# Patient Record
Sex: Male | Born: 2000 | Race: White | Hispanic: No
Health system: Western US, Academic
[De-identification: ages and names within clinical notes are randomized; demographics above are authoritative.]

## PROBLEM LIST (undated history)

## (undated) MED ORDER — BD PEN NEEDLE MINI ULTRAFINE 31G X 5 MM MISC
Status: AC
Start: 2019-08-23 — End: ?

---
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
## 2019-08-22 ENCOUNTER — Other Ambulatory Visit (HOSPITAL_BASED_OUTPATIENT_CLINIC_OR_DEPARTMENT_OTHER): Payer: Self-pay | Admitting: Pediatric Endocrinology

## 2019-11-06 ENCOUNTER — Encounter (HOSPITAL_BASED_OUTPATIENT_CLINIC_OR_DEPARTMENT_OTHER): Payer: 59 | Admitting: "Endocrinology

## 2019-11-06 ENCOUNTER — Ambulatory Visit (HOSPITAL_BASED_OUTPATIENT_CLINIC_OR_DEPARTMENT_OTHER): Payer: 59

## 2020-01-13 IMAGING — CR DX Spine Lumbar AP-Lat
1 series · 3 of 3 positions shown · non-contrast
Comparison: None available

Lumbar spine 2 views
INDICATION: Ground-level fall, pain

[Series 1: ap · 0.20mm/px · 3 of 3 slices shown]
[im 1/3]
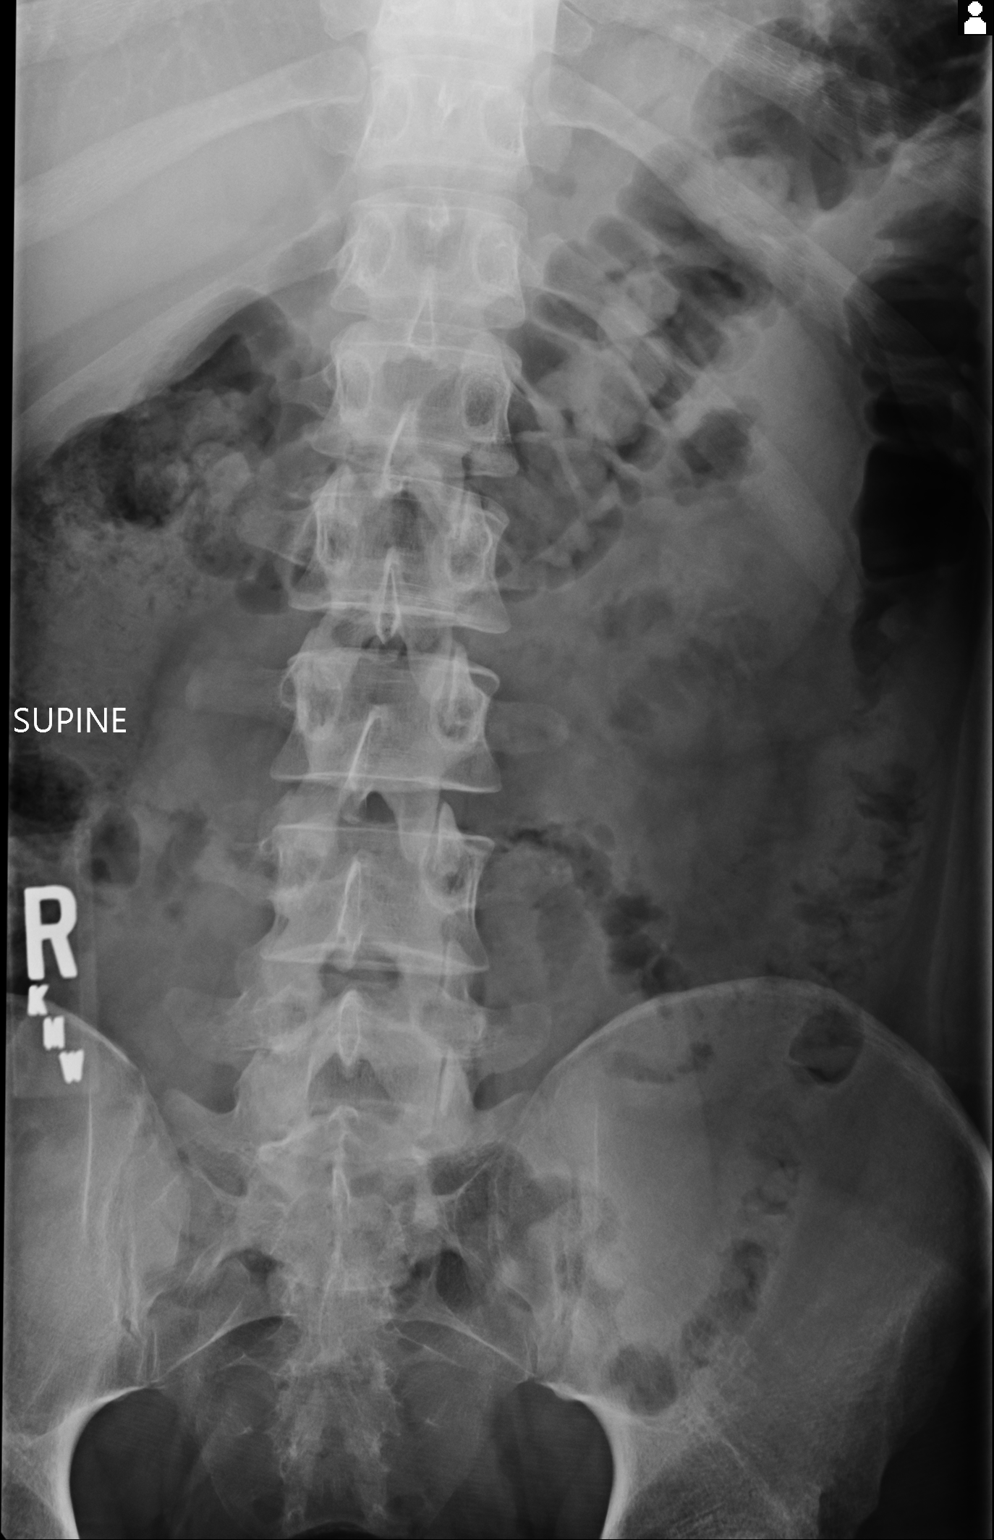
[im 2/3]
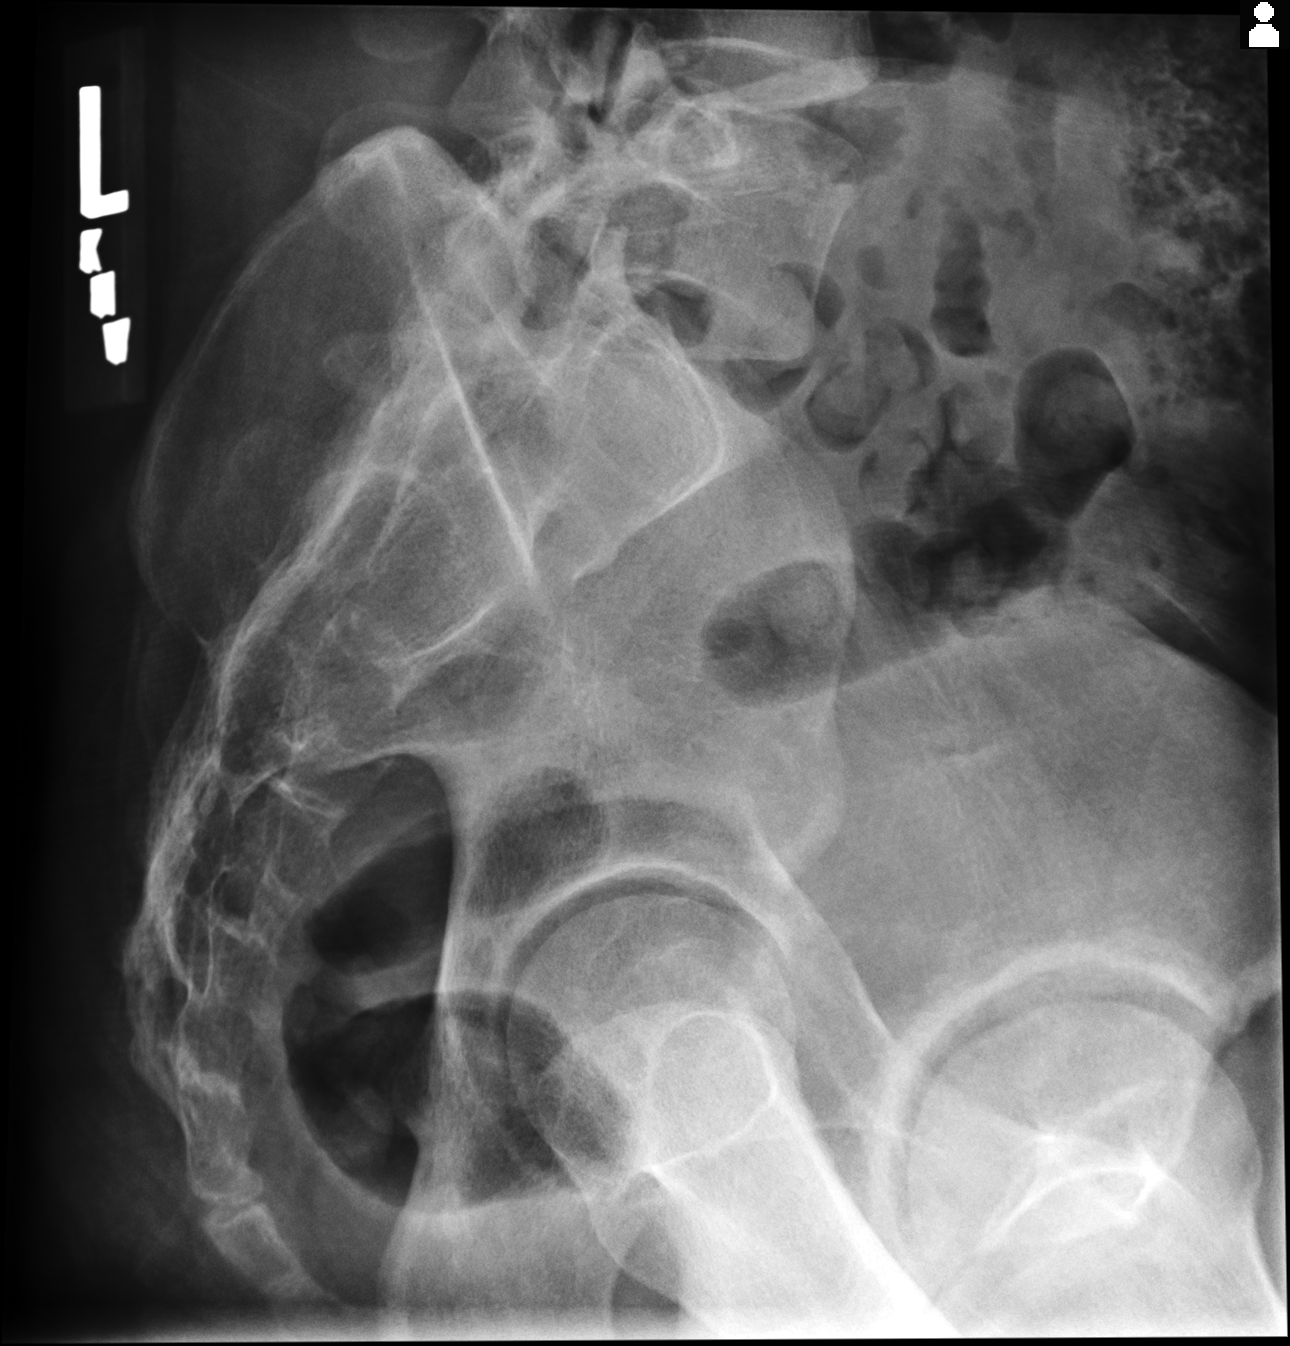
[im 3/3]
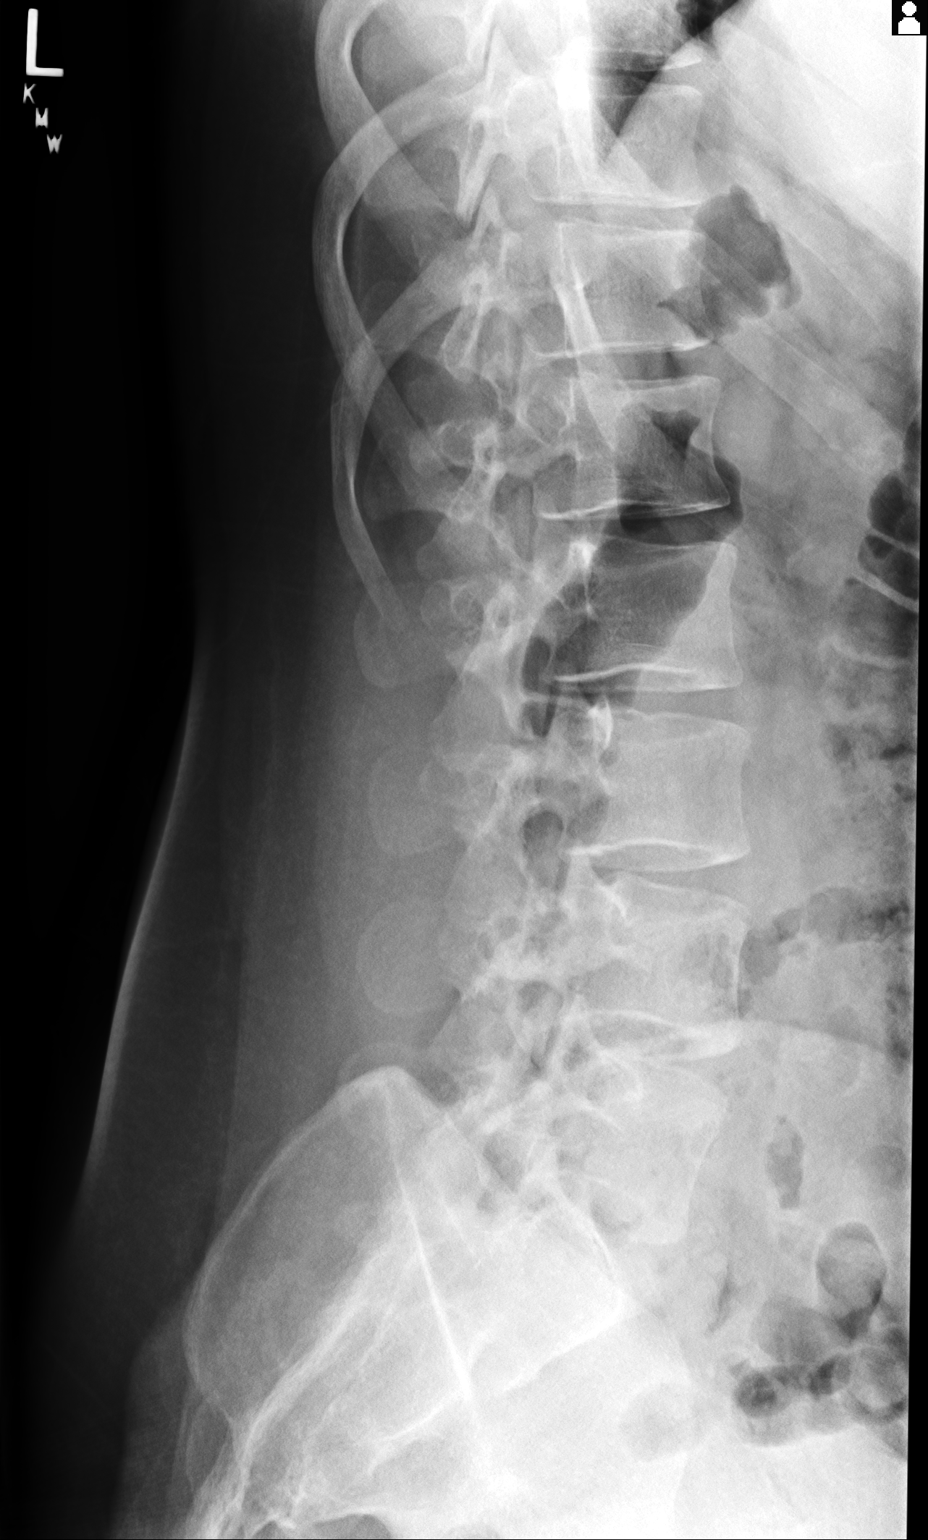

[3 of 3 positions shown; findings below may reference images not displayed]

FINDINGS: There are 5 nonrib-bearing lumbar vertebrae. No evidence of fracture            
 or subluxation. Disc spaces are preserved.
IMPRESSION: No evidence of acute osseous abnormality in the lumbar spine.

## 2020-01-15 ENCOUNTER — Ambulatory Visit (HOSPITAL_BASED_OUTPATIENT_CLINIC_OR_DEPARTMENT_OTHER): Payer: 59

## 2020-04-14 NOTE — Progress Notes (Addendum)
[facility]CENT AND YOUNG [facility]ULT DIABETES CLINIC    Date of service: 04/15/20  Identification: Vincent Anderson is a 19 year old with type 1 diabetes.  -- Date of diabetes diagnosis: 12/25/2004   -- Complications from diabetes:none  -- Secondary diagnoses: ADD, Depression & anxiety (2018), celiac disease    Clinical Care:  -- Received pediatric diabetes care at [facility]; last provider Dr. Danelle Earthly on 08/2019  -- First [facility] AYA visit: 06/2018  -- Last [facility] AYA visit: 04/15/20    Diabetes Self-Management and Interval History:  -- overall doing well  -- in college, studying finances and real estate, just finished with his final exams  -- was able to attend class in-person  -- describes himself as perfectionist, he is meticulous with glycemic control, however afraid hypoglycemia and if he is not sure about CHO content in a meal he prefers to give lower rather than higher dose of insulin  -- was started on Tslim in 08/2019, likes the pump, but struggles with hypolgycemia associated with exercise  -- exercises 5-6x week, trying to lose weight, his weight goal - 220 - 225  -- Reports no significant hyperglycemia/hypoglycemia    Nutrition/Exercise/Safety  -- Meal pattern (e.g., low-carb; skipped meals; late night meals): Gluten-free diet; "not a big breakfast eater", for breakfast he usually has apple, banana, protein bar, lunch - big meal replacement protein shake, salad with chicken, largest meal - dinner - ground beef, tacos, hamburgers, fruit or vegetable, rice, potatoes, french fries, sometimes desert - gluten free cookies or muffins or icecream - overall on low CHO diet with average CHO intake of 96g/day  -- Physical activity: cardio, lifting 45 - 60 min - 4x/week, running 2-5 miles - 45 - 60 min - 5-6x/week has significant drop BG 250 ->80 first 15 min of exercise  -- Exercise management: Used to suspend boluses insulin when on pump, trying not to eating before exercise, since trying to lose weight  -- prandial insulin: 15 min before meal,  sometimes do additional bolus if underestimated with meal, uses 3 boluses for protein shake    -- Driving: yes; uses sensor, no hypoglycemic episodes while driving, has juice with him  -- Drug/EtOH use: seltzer, hard liquor - once a week, 4-5 drinks - puts his insulin pump into exercise mode, takes with CHO, no hypoglycemic episodes with alcohol intake    Since last visit:  -- ER/Hospital visit related to diabetes: No  -- Glucagon use: No [Has Baqsimi]    Review of Systems:   -- A complete ROS was performed. Pertinent negatives: no numbness, no tingling; no abdominal pain, no constipation; no fatigue, no temperature intolerance. The rest of the ROS is negative.     Social History:  -- Will be starting sophomore year in Fall at [employer]  -- Lives in dorms (has strong support)    Family History:  -- No diabetes in family  -- Mom, maternal grandmother, and older sister have thyroid issues  -- Younger sister with celiac disease    Insulin Regimen:       Insulin pump setting:    Basal insulin is manual mode is very close to basal/autoboluses - used in automode    Glucose Monitoring:   - reviewed:    Interpretation of CGM data:  -- Reliable sensor wear: Yes  -- Basal profile appropriate: Yes  -- Postprandial excursion after daytime meals: Mild postprandial hyperglycemia  -- Correction bolus appropriate: Yes  -- Worrisome hypoglycemia: No   Current regimen of the  day, sleep/wake cycle, eating patterns, physical activity is very different from his college life, back to college on 05/06/19    Psychosocial screening:  Last psychologist/SW visit: likely on 04/15/20  Last completed: 04/15/20  -- PHQ-9: 5 (mild); no suicidal ideation  -- PAID-T: 17 (low diabetes distress)  -- GAD-7: 4 (no anxiety)  -- DEPS-R: 13(low risk for disordered eating)   -- CD-RISC 10 - 32    Physical Examination:  BP 116/69    Pulse 62    Ht 6' 0.5" (1.842 m)    Wt (!) 106.6 kg (235 lb)    BMI 31.43 kg/m   GENERAL: not in acute distress, obese, no  cushingoid features  HEENT: atraumatic, normocephalic  EYES: PERRLA, EOMI  NECK: supple  CARDIO: RRR, no peripheral edema  RESP: breathing comfortably on room air  GI: abdomen soft, nondistended  MSK: muscle stength 5/5  SKIN: warm, dry, no lesions  NEURO: A&O x 3, no focal neurologic deficits  PSYCH: pleasant, good mood, adequate judgement    Labs:  - reviewed          Assessment and Plan:   1. Type 1 Diabetes: 19 yo M with DM1 x 15 without complications, celiac disease. \  - glycemia is well controlled: uses CGM 97%,  TIR - 72%, minimal episodes of mild hypoglycemia.   - he reports having significant drop BG with exercise and multiple hypoglycemic episodes that were associated with physical activity, despite using exercise mode: patient was not exercising much lately because of that  - will keep current settings on his insulin pump but make 2nd basal rate mode/activity mode with lower rate of basal insulin and higher ISF to use during exercise in conjunction with exercise mode on insulin pump:    Insulin regimen for exercise activity:  Start time Basal rate Correction factor Carb ratio   midnight 1.7 -> 1.6 1:20 -> 1:30 1:5   3 am 1.5 -> 1.4 1:20 -> 1:30 1:5   7 am 1.8 -> 1.7 1:20 -> 1:30 1:5   11 am 1.6 -> 1.5 1:20 -> 1:30 1:5 -> 1:6   7 pm 1.5 -> 1.4 1:20 -> 1:30 1: 5 -> 1:6     2. AYA Care - Transition Readiness:  Diabetes education: Ongoing needs: yes  Nutrition: Ongoing needs: yes  Healthcare Navigation: Ongoing needs: yes  Psychosocial Support: Ongoing needs: yes  GRADUATE FROM PROGRAM: NO    3. Diabetes Complications/Screening:  Retinopathy Screening: not due  Last exam: August 2021, no evidence of DR  No history of diabetic retinopathy    Neuropathy Screening:  Denies symptoms of peripheral or autonomic neuropathy    Autoimmune Conditions Screening: TSH wnl on 06/2018; positive celiac screen     Dyslipidemia Screening: LDL at desired level on 06/2018    Microalbuminuria Screening: no evidence of  albuminuria - 1 year ago    Blood Pressure: wnl    4. Preventative Care:  Established with PCP: yes Dr. Iona Coach  Received PPSV23 vaccine: yes  Received influenza vaccine: yes (12/2019)  COVID-19: Pfizer - 3 (last booster on 12/2019)    5. Celiac disease  - continue gluten free diet    Follow-up: Will plan to continue care within AYA Diabetes Program at this time with a follow up visit in 3 month's time.    To improve glycemic control, patient is instructed how to self-adjust insulin up to 30% from baseline dose.    Thank you for allowing [place] to take  part in the care of this patient. We will continue to follow. Recommendations conveyed to the primary team. Attending addendum to follow. Please page the endocrine fellow on call with questions.     This patient was seen and discussed with Endocrinology attending, Dr.Weaver    Nadean Corwin, MD  Fellow  Department of Endocrinology

## 2020-04-14 NOTE — Progress Notes (Signed)
[facility] AYA Note  Note Type:  New  Time spent with Mitzi Hansen: 20 mins      CC  Vincent Anderson is a 19 year old y/o male with a 15 year history of Type 1 Diabetes Mellitus here today for his first [place] visit with Dr Con Memos.  [facility] AYA RN  met with Mitzi Hansen for orientation to [place] clinic.    SUBJECTIVE:     Current Diabetes management:  Tandem T Slim with Control IQ  Vendors: pharmacy       Experience with CGM: Dexcom G6-pharmacy    OBJECTIVE:  Not assessed    ASSESSMENT/PLAN:    Today we reviewed the following:    WELCOME!  Contact info for clinic  When to use vs. when to not use eCare/MyChart  Expected response time from medical team    Orientated to [facility] website  Orientation to AYA philosophy  o We are a multidisciplinary care team of physicians (both adult and pediatric endocrinologists), nurse educator, a dietitian, and a Education officer, museum.  o Primary focus of the team is to address the unique challenges that arise during the transition time and prepare young adults for a successful life of managing diabetes    Confirm medications and pharmacy    F/U:  READDY learning needs for follow up visits: see media

## 2020-04-15 ENCOUNTER — Ambulatory Visit (HOSPITAL_COMMUNITY): Payer: Self-pay

## 2020-04-15 ENCOUNTER — Encounter (HOSPITAL_BASED_OUTPATIENT_CLINIC_OR_DEPARTMENT_OTHER): Payer: Self-pay | Admitting: "Endocrinology

## 2020-04-15 ENCOUNTER — Ambulatory Visit (HOSPITAL_BASED_OUTPATIENT_CLINIC_OR_DEPARTMENT_OTHER): Payer: 59

## 2020-04-15 ENCOUNTER — Ambulatory Visit (HOSPITAL_BASED_OUTPATIENT_CLINIC_OR_DEPARTMENT_OTHER): Payer: 59 | Admitting: Clinical

## 2020-04-15 ENCOUNTER — Ambulatory Visit: Payer: 59 | Attending: Clinical | Admitting: "Endocrinology

## 2020-04-15 ENCOUNTER — Other Ambulatory Visit (HOSPITAL_BASED_OUTPATIENT_CLINIC_OR_DEPARTMENT_OTHER): Payer: Self-pay | Admitting: "Endocrinology

## 2020-04-15 VITALS — BP 116/69 | HR 62 | Ht 72.5 in | Wt 235.0 lb

## 2020-04-15 DIAGNOSIS — E109 Type 1 diabetes mellitus without complications: Secondary | ICD-10-CM

## 2020-04-15 LAB — BASIC METABOLIC PANEL
Anion Gap: 9 (ref 4–12)
Calcium: 9.6 mg/dL (ref 8.9–10.2)
Carbon Dioxide, Total: 29 meq/L (ref 22–32)
Chloride: 100 meq/L (ref 98–108)
Creatinine: 0.85 mg/dL (ref 0.51–1.18)
Glucose: 184 mg/dL — ABNORMAL HIGH (ref 62–125)
Potassium: 4.1 meq/L (ref 3.6–5.2)
Sodium: 138 meq/L (ref 135–145)
Urea Nitrogen: 13 mg/dL (ref 8–21)
eGFR by CKD-EPI: >60 mL/min/{1.73_m2} (ref >59)

## 2020-04-15 LAB — LIPID PANEL
Cholesterol (LDL): 111 mg/dL (ref <130)
Cholesterol/HDL Ratio: 3.3
HDL Cholesterol: 51 mg/dL (ref >39)
Non-HDL Cholesterol: 118 mg/dL (ref 0–159)
Total Cholesterol: 169 mg/dL (ref <200)
Triglyceride: 37 mg/dL (ref <150)

## 2020-04-15 LAB — HEMOGLOBIN A1C, RAPID: Hemoglobin A1C: 6.6 % — ABNORMAL HIGH (ref 4.0–6.0)

## 2020-04-15 LAB — TSH WITH REFLEXIVE FREE T4: TSH with Reflexive Free T4: 3.038 u[IU]/mL (ref 0.400–5.000)

## 2020-04-15 MED ORDER — BAQSIMI TWO PACK 3 MG/DOSE NA POWD
3.0000 mg | Freq: Once | NASAL | 3 refills | Status: DC | PRN
Start: 2020-04-15 — End: 2021-10-10

## 2020-04-15 MED ORDER — PRECISION XTRA KETONE VI STRP
ORAL_STRIP | 3 refills | Status: DC
Start: 2020-04-15 — End: 2022-09-28

## 2020-04-15 MED ORDER — LANTUS SOLOSTAR 100 UNIT/ML SC SOPN
PEN_INJECTOR | SUBCUTANEOUS | 3 refills | Status: DC
Start: 2020-04-15 — End: 2020-10-01

## 2020-04-15 MED ORDER — INSULIN ASPART 100 UNIT/ML IJ SOLN
INTRAMUSCULAR | 3 refills | Status: DC
Start: 2020-04-15 — End: 2021-04-14

## 2020-04-15 MED ORDER — BLOOD GLUCOSE MONITORING SUPPL KIT
PACK | 1 refills | Status: DC
Start: 2020-04-15 — End: 2022-01-26

## 2020-04-15 MED ORDER — GLUCOSE BLOOD VI STRP
ORAL_STRIP | 3 refills | Status: DC
Start: 2020-04-15 — End: 2022-01-26

## 2020-04-15 MED ORDER — DEXCOM G6 SENSOR MISC
3 refills | Status: DC
Start: 2020-04-15 — End: 2021-04-14

## 2020-04-15 MED ORDER — AUTOSOFT 90 INFUSION SET MISC
3 refills | Status: AC
Start: 2020-04-15 — End: ?

## 2020-04-15 MED ORDER — DEXCOM G6 TRANSMITTER MISC
3 refills | Status: DC
Start: 2020-04-15 — End: 2021-01-27

## 2020-04-15 MED ORDER — LANCETS THIN MISC
3 refills | Status: AC
Start: 2020-04-15 — End: ?

## 2020-04-15 MED ORDER — LANCET DEVICE WITH EJECTOR MISC
1 refills | Status: AC
Start: 2020-04-15 — End: ?

## 2020-04-15 MED ORDER — T:SLIM X2 3ML CARTRIDGE MISC
3 refills | Status: AC
Start: 2020-04-15 — End: ?

## 2020-04-15 MED ORDER — INSULIN PEN NEEDLE 32G X 4 MM MISC
3 refills | Status: AC
Start: 2020-04-15 — End: ?

## 2020-04-15 NOTE — Progress Notes (Signed)
[place] AYA Diabetes Transition Program  Social Work Biopsychosocial Assessment    REFERRAL INFORMATION    Reason for referral: Vincent Anderson presents for a new visit in the AYA [facility] after transferring from [facility]  Present for Interview: Vincent Anderson   Presenting Medical Concern: Type 1 Diabetes; Celiac, Depression; Anxiety  Diagnosis Date: 2006  Diabetes management: DexCom/Tandem    SOCIAL HISTORY    Lives where: [place], [place] in college dorm. When home for the holidays, lives in [place], [place]. This coming summer Vincent Anderson will be living in [facility] for 8 weeks.   Lives with: Vincent Anderson lives with his parents and 59 year old sister when he is home for the holidays. At school he lives in a dorm but has a single. He reports that his room is bigger than typical and there is a constant flow of people in and out of his room to hang out.   Finances/Income: Vincent Anderson is financially supported by his parents and does not work.  Insurance: Vincent Anderson is covered by his parents insurance on [employer]   Social Stressors: Vincent Anderson identifies academic pressure as his number one stressor right now. He just completed finals for the quarter and reports a high level of stress and lack of sleep related to these.   Social Supports: Vincent Anderson reports feeling very well supported by friends and family.   Family make-up, functioning and involvement: This was not thoroughly discussed. Vincent Anderson's parents and 27 year old sister live in [place].   Social functioning: No self reported concerns. Vincent Anderson does describe himself as having a "short social battery," and reports that he recharges with time alone to reflect on his thoughts. He also describes plenty of social activities that he engages in at school.   Interests and hobbies: This was not specifically discussed. Vincent Anderson does report going to football games at school.   Religious, spiritual or cultural details: Nothing relevant reported.   Work, where, schedule, accommodations, and any concerns: Vincent Anderson is not  currently working. He has an internship lined up for the summer in [place] but details of this were not discussed yet.   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 Vincent Anderson didn't report any concerns outside of the typical stressors of school work. Accommodations were not discussed today.     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 Vincent Anderson reports that currently his symptoms of depression and anxiety are well managed. He reports that sometimes he wonders if his stress about school is heightened because of his anxiety. He manages by keeping lists to make sure he doesn't forget things he needs to do when it comes to school work and doctors appointments. Vincent Anderson doesn't feel this habit of list keeping is obsessive or intrusive. Vincent Anderson has no history of SI or NSSI.   Past or current Psychological interventions: Vincent Anderson has been on a shifting dose of Effexor over the years and sees a psychiatrist at [facility] every couple of months. He hasn't seen a therapist separately but finds it helpful to talk through thoughts and emotions with his psychiatrist.   Substance use: Vincent Anderson reports some social drinking in the context of social events, such as  tailgating before a football game. He reports that he is typically the designated driver, but will sometimes drink. He feels "okay" about drinking and managing diabetes and reports he has never had a "scare" with it, and that he has never been in a position of being out of control (never blacked out). He reports he prepares for drinking by lowering basal rights and making sure to eat before he goes to sleep.   Other risk behaviors:  Vincent Anderson reports very occasional vaping.    DIABETES CARE & FUNCTIONING    Course of coping with diabetes since diagnosis, and current thoughts and feelings about diabetes: This was not explored in very much detail today due to timing restrictions. Vincent Anderson shared that he has had "diabetes for forever," and feels very comfortable managing it. Vincent Anderson noted that the transition from high school to college with diabetes went smoothly since his parents had transitioned over much of the daily responsibilities of diabetes to [place] during high school.   Current diabetes care, level of independence, areas of concerns: Vincent Anderson is completely independent managing daily care. He and his mom co-manage prescriptions; sometimes Vincent Anderson refills and sometimes his mom does, and they each communicate with the other. Vincent Anderson makes his own doctors appointments now. Vincent Anderson currently "delegates" calls to Tandem, DexCom and Vincent Anderson Pouch to his mom if issues arise since he feels this is an area he doesn't understand as well.   Personal goals regarding diabetes management: Vincent Anderson is reports that he has a goal of learning more about how to navigate insurance and prescriptions independently. We discussed some steps to take towards this goal. These included communicating with mom about this goal while home for the holidays, "shadowing" mom when she makes calls, and slowing starting to take over small tasks. SW also referred Vincent Anderson to [employer] on [employer] for some learning.     IMPRESSION  Vincent Anderson is a 19 year old male patient with an approximate 16 year history of type 1 diabetes, also with a history of depression and anxiety. Vincent Anderson appears to be managing the daily aspects of his diabetes quiet well on his own. He continues to receive support from parents to handle administrative aspects of care, but there are some aspects of this responsibility that he has taken over since moving away to college. Itzael self identifies  learning more about how his insurance works and how to navigate this system as an overall goal of his while in the care of the AYA team. There were no significant psychosocial concerns that arose today during our visit.     INTERVENTIONS   Provided information on the role of the social worker in the AYA clinic and discussed what to expect regarding visits and follow-up, as well as how to reach SW between visits   Gathered biopsychosocial history to assist and inform resources and interventions offered   Assessed current coping with diabetes and other psychosocial stressors   Assessed current state of independence in diabetes management and understanding of healthcare navigation     PLAN   SW will plan to follow-up with Roni as needed to provide support around goals of [facility] independence

## 2020-04-15 NOTE — Patient Instructions (Signed)
New activity mode: Exc. Use this profile when exercising. Also turn on Activity mode.     Send me your [employer] login and password

## 2020-04-16 NOTE — Progress Notes (Signed)
I saw and evaluated the patient and agree with Dr.Zykova's note.

## 2020-04-24 ENCOUNTER — Encounter (HOSPITAL_BASED_OUTPATIENT_CLINIC_OR_DEPARTMENT_OTHER): Payer: Self-pay | Admitting: "Endocrinology

## 2020-04-29 ENCOUNTER — Other Ambulatory Visit (HOSPITAL_BASED_OUTPATIENT_CLINIC_OR_DEPARTMENT_OTHER): Payer: Self-pay | Admitting: Pharmacist

## 2020-04-29 NOTE — Telephone Encounter (Signed)
Refill not appropriate. Patient using Tandem pump. See 12/29 e-care message

## 2020-04-29 NOTE — Telephone Encounter (Signed)
Sent email to Tawnya Crook to ask if she can assist getting [facility] set up with supplies with new AYA provider.

## 2020-04-29 NOTE — Telephone Encounter (Signed)
Not on the current med list.

## 2020-06-14 ENCOUNTER — Encounter (HOSPITAL_BASED_OUTPATIENT_CLINIC_OR_DEPARTMENT_OTHER): Payer: Self-pay | Admitting: "Endocrinology

## 2020-06-18 NOTE — Telephone Encounter (Signed)
No data on [facility] since 04/15/2020.

## 2020-08-12 ENCOUNTER — Encounter (HOSPITAL_BASED_OUTPATIENT_CLINIC_OR_DEPARTMENT_OTHER): Payer: Self-pay | Admitting: "Endocrinology

## 2020-08-14 NOTE — Telephone Encounter (Signed)
Appointment changed to Telemedicine

## 2020-08-21 ENCOUNTER — Encounter (HOSPITAL_BASED_OUTPATIENT_CLINIC_OR_DEPARTMENT_OTHER): Payer: Self-pay

## 2020-08-30 ENCOUNTER — Encounter (HOSPITAL_BASED_OUTPATIENT_CLINIC_OR_DEPARTMENT_OTHER): Payer: Self-pay | Admitting: "Endocrinology

## 2020-08-30 NOTE — Telephone Encounter (Signed)
[facility] Diabetes Clinic Telemedicine Visit 09/02/2020 - Questionnaires

## 2020-09-01 ENCOUNTER — Telehealth (HOSPITAL_BASED_OUTPATIENT_CLINIC_OR_DEPARTMENT_OTHER): Payer: Self-pay | Admitting: "Endocrinology

## 2020-09-01 NOTE — Telephone Encounter (Signed)
E-care message to pt to follow up.    LVM ALSO

## 2020-09-02 ENCOUNTER — Ambulatory Visit: Payer: 59 | Attending: "Endocrinology | Admitting: "Endocrinology

## 2020-09-02 ENCOUNTER — Encounter (HOSPITAL_BASED_OUTPATIENT_CLINIC_OR_DEPARTMENT_OTHER): Payer: Self-pay | Admitting: "Endocrinology

## 2020-09-02 ENCOUNTER — Encounter (HOSPITAL_BASED_OUTPATIENT_CLINIC_OR_DEPARTMENT_OTHER): Payer: Self-pay

## 2020-09-02 VITALS — Ht 72.0 in | Wt 225.0 lb

## 2020-09-02 DIAGNOSIS — E109 Type 1 diabetes mellitus without complications: Secondary | ICD-10-CM | POA: Insufficient documentation

## 2020-09-02 NOTE — Progress Notes (Signed)
ADOLESCENT AND YOUNG ADULT DIABETES CLINIC  Follow-up visit    Date of service: 09/02/20  Identification: Vincent Anderson is a 20 year old with type 1 diabetes.  -- Date of diabetes diagnosis: 12/25/2004   -- Complications from diabetes:none  -- Secondary diagnoses: ADD, Depression & anxiety (2018), celiac disease    Clinical Care:  -- Received pediatric diabetes care at [facility]; last provider Dr. Danelle Earthly on 08/2019  -- First [facility] AYA visit: 06/2018  -- Last [facility] AYA visit: 04/15/20    Diabetes Self-Management and Interval History:  Patient is overall doing well.  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  At the last visit, we reduced the patient's basal rates and the patient says that he has also been exercising more, working on his diet and has lost about 10 pounds.  Patient has time in range has improved to 78%.  Patient says that he has less hypoglycemic episodes however he does note exercise-induced hypoglycemia is the most common cause.  Otherwise no new medical problems, no hospitalizations, patient says he is doing well in terms of his mental health and overall is excited for his internship.        Nutrition/Exercise/Safety  -- Meal pattern (e.g., low-carb; skipped meals; late night meals): Gluten-free diet; "not a big breakfast eater", for breakfast he usually has apple, banana, protein bar, lunch - big meal replacement protein shake, salad with chicken, largest meal - dinner - ground beef, tacos, hamburgers, fruit or vegetable, rice, potatoes, french fries, sometimes desert - gluten free cookies or muffins or icecream - overall on low CHO diet with average CHO intake of 96g/day  -- Physical activity: cardio, lifting 45 - 60 min - 4x/week, running 2-5 miles - 45 - 60 min - 5-6x/week has significant drop BG 250 ->80 first 15 min of exercise  -- Exercise management: Used to suspend boluses insulin when on pump, trying not to eating before exercise, since trying to  lose weight  -- prandial insulin: 15 min before meal, sometimes do additional bolus if underestimated with meal, uses 3 boluses for protein shake    -- Driving: yes; uses sensor, no hypoglycemic episodes while driving, has juice with him  -- Drug/EtOH use: seltzer, hard liquor - once a week, 4-5 drinks - puts his insulin pump into exercise mode, takes with CHO, no hypoglycemic episodes with alcohol intake    Since last visit:  -- ER/Hospital visit related to diabetes: No  -- Glucagon use: No [Has Baqsimi]    Review of Systems:   -- A complete ROS was performed. Pertinent negatives: no numbness, no tingling; no abdominal pain, no constipation; no fatigue, no temperature intolerance. The rest of the ROS is negative.     Social History:  -- Will be starting junior year in Fall at [employer]  -- Lives in dorms (has strong support)    Family History:  -- No diabetes in family  -- Mom, maternal grandmother, and older sister have thyroid issues  -- Younger sister with celiac disease    Insulin Regimen:       Insulin pump setting:    We noted the patient's exercise profile could use a reduction in terms of the basal rate as well as an adjustment to the correction factor.  We recommend changing the correction factor to 1: 50, and we will reduce the basal rate to help with exercise-induced hyperglycemia.    Glucose Monitoring:   -  reviewed:          Time in range has improved to 78%, patient overall is having excellent glycemic control.    Interpretation of CGM data:  -- Reliable sensor wear: Yes  -- Basal profile appropriate: Yes  -- Correction bolus appropriate: Yes  -- Worrisome hypoglycemia: No   Current regimen of the day, sleep/wake cycle, eating patterns, physical activity is very different from his college life, back to college in the fall    Psychosocial screening:  Last psychologist/SW visit: likely on 07/2020  Last completed: 07/2020  -- PHQ-9: 5 (mild); no suicidal ideation  -- PAID-T: 17 (low diabetes distress)  --  GAD-7: 4 (no anxiety)  -- DEPS-R: 13(low risk for disordered eating)   -- CD-RISC 10 - 32    Physical Examination:  There were no vitals taken for this visit.  GENERAL: not in acute distress, obese, no cushingoid features  HEENT: atraumatic, normocephalic  EYES: PERRLA, EOMI  NEURO: A&O x 3, no focal neurologic deficits  PSYCH: pleasant, good mood, adequate judgement    Labs:  - reviewed            Labs from December 2021 show LDL of 111, normal TSH of 3, A1c of 6.6 which is at goal  Assessment and Plan:   1. Type 1 Diabetes: 20 yo M with DM1 x 15 without complications, celiac disease. \  - glycemia is well controlled: uses CGM 97%,  TIR - 78%, minimal episodes of mild hypoglycemia likely from exercise-induced hypoglycemia, we discussed how tandem insulin pump is challenging with exercise mode because it still gives correction boluses which can lead to hypoglycemia.   - will keep current settings on his insulin pump but make adjustments to the patient's exercise activity mode to lower the basal rate and to adjust the correction factor:    Insulin regimen for exercise activity:  Start time Basal rate Correction factor Carb ratio   midnight 1.6 -> 1.5 1:30 -> 1:50 1:5   3 am  1.4 1:30 -> 1:50 1:5   7 am 1.6-->1.4 1:30 -> 1:50 1:5   11 am 1.5 -> 1.4 1:30 -> 1:50  1:6   7 pm 1.4 1:30 -> 1:50 1:6             2. AYA Care - Transition Readiness:  Diabetes education: Ongoing needs: yes  Nutrition: Ongoing needs: yes  Healthcare Navigation: Ongoing needs: yes  Psychosocial Support: Ongoing needs: yes  GRADUATE FROM PROGRAM: NO    3. Diabetes Complications/Screening:  Retinopathy Screening: not due  Last exam: August 2021, no evidence of DR  No history of diabetic retinopathy    Neuropathy Screening:  Denies symptoms of peripheral or autonomic neuropathy    Autoimmune Conditions Screening: TSH wnl on 03/2020; positive celiac screen     Dyslipidemia Screening: LDL at desired level on 03/2020    Microalbuminuria Screening: no  evidence of albuminuria - 1 year ago    Blood Pressure: wnl    4. Preventative Care:  Established with PCP: yes Dr. Iona Coach  Received PPSV23 vaccine: yes  Received influenza vaccine: yes (12/2019)  COVID-19: Pfizer - 3 (last booster on 12/2019)    5. Celiac disease  - continue gluten free diet    Follow-up: Will plan to continue care within AYA Diabetes Program at this time with a follow up visit in 3 month's time.    To improve glycemic control, patient is instructed how to self-adjust insulin up to 30% from  baseline dose.    Thank you for allowing [place] to take part in the care of this patient. We will continue to follow. Recommendations conveyed to the primary team. Attending addendum to follow. Please page the endocrine fellow on call with questions.     This patient was seen and discussed with Endocrinology attending, Dr.Weaver    Thank you for allowing me to be involved in this patient's care.    Monica Martinez MD  Fellow, Department of Endocrinology  [facility]

## 2020-09-02 NOTE — Patient Instructions (Signed)
Here are a few tips to help you navigate your healthcare needs:      IF YOU ARE HAVING AN EMERGENCY CALL 911     Refills:  Call your pharmacy at least 4 working days before you run out. Do not call the clinic or use email or eCare for refill requests, it's quicker and safer to go through the pharmacy.    . Test Results: Most results are available within 7 days; many are available sooner through eCare. I will contact you of test results by eCare or letter unless there is something urgent, in which case I will call you sooner.     Use eCare to securely communicate with me or our care team for non-urgent issues (questions or concerns that can wait 3 working days before requiring an answer). If you have a long or complex question or have a new issue, please make an appointment to discuss.  Call [phone] to sign-up for eCare or ask your MA to help you sign-up today.     . Urgent Symptoms:  Call [phone], at any time and select option 8. Our clinic staff will help you during regular hours; after hours, our on-call nurses will help you.

## 2020-09-03 NOTE — Progress Notes (Signed)
I saw and evaluated the patient and agree with Dr.Popli's note

## 2020-09-13 ENCOUNTER — Telehealth (HOSPITAL_BASED_OUTPATIENT_CLINIC_OR_DEPARTMENT_OTHER): Payer: Self-pay

## 2020-09-13 DIAGNOSIS — E109 Type 1 diabetes mellitus without complications: Secondary | ICD-10-CM

## 2020-09-13 NOTE — Telephone Encounter (Signed)
PAC received PA request from [facility] via fax:

## 2020-09-22 ENCOUNTER — Emergency Department: Payer: Self-pay

## 2020-09-30 ENCOUNTER — Other Ambulatory Visit (HOSPITAL_BASED_OUTPATIENT_CLINIC_OR_DEPARTMENT_OTHER): Payer: Self-pay

## 2020-09-30 NOTE — Telephone Encounter (Signed)
Forwarding to RN team for f/u

## 2020-09-30 NOTE — Telephone Encounter (Signed)
Lantus Solostar is a non-formulary medication on the patients' insurance plan [employer]).     Please consider changing to [employer].   (The prescription can be entered using this current telephone encounter)    Pharmacy: [id number] [phone] [address] [place] [phone]     If the option(s) provided are not are appropriate please provide clinical rationale so a prior authorization exception may be pursued.     Note: Pharmacy's fax was from [facility] in [place]. Not sure if pt still goes to [facility].

## 2020-10-01 NOTE — Telephone Encounter (Signed)
Pending prescription for [employer]- preferred per insurance formulary. Routing to [facility] for signature.

## 2020-10-01 NOTE — Addendum Note (Signed)
Addended by: Livingston Diones on: 10/01/2020 07:45 AM     Modules accepted: Orders

## 2020-10-02 MED ORDER — BASAGLAR KWIKPEN 100 UNIT/ML SC SOPN
PEN_INJECTOR | SUBCUTANEOUS | 1 refills | Status: DC
Start: 2020-10-02 — End: 2022-09-28

## 2020-10-02 NOTE — Addendum Note (Signed)
Addended by: Sanjuana Mae on: 10/02/2020 08:34 AM     Modules accepted: Orders

## 2020-11-28 ENCOUNTER — Telehealth (HOSPITAL_BASED_OUTPATIENT_CLINIC_OR_DEPARTMENT_OTHER): Payer: Self-pay

## 2020-11-28 ENCOUNTER — Encounter (HOSPITAL_BASED_OUTPATIENT_CLINIC_OR_DEPARTMENT_OTHER): Payer: Self-pay | Admitting: "Endocrinology

## 2020-11-28 NOTE — Telephone Encounter (Signed)
Pt states he has new insurance and needs PA done Dexcom sensors and trasnmitters.

## 2020-12-03 ENCOUNTER — Other Ambulatory Visit (HOSPITAL_BASED_OUTPATIENT_CLINIC_OR_DEPARTMENT_OTHER): Payer: Self-pay

## 2020-12-03 NOTE — Telephone Encounter (Signed)
Duplicate- resolved in PA encounter.

## 2020-12-03 NOTE — Telephone Encounter (Signed)
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    The other plan covers Dexcom G6. Test claims successful for Receiver, Transmitter and Sensors.    [employer] ([facility]))      Since this is a new plan, please have pharmacy run claims for what needs to be refilled.      If a prior authorization is needed, the pharmacy can fax request to clinic.    Thank You,  Gaynelle Arabian, CPhT  [place]    NOTE:  The [facility] does not process requests for Tier Exceptions.     PAC does not provide co-pay billing support.

## 2020-12-23 ENCOUNTER — Encounter (HOSPITAL_BASED_OUTPATIENT_CLINIC_OR_DEPARTMENT_OTHER): Payer: Self-pay | Admitting: "Endocrinology

## 2020-12-25 NOTE — Telephone Encounter (Signed)
Incoming SMN from [facility]  CN.09-02-20 LAB 04-15-20  To Provider for review and signature.  Signed, faxed, scanned to media

## 2021-01-27 ENCOUNTER — Other Ambulatory Visit (HOSPITAL_BASED_OUTPATIENT_CLINIC_OR_DEPARTMENT_OTHER): Payer: Self-pay | Admitting: "Endocrinology

## 2021-01-27 DIAGNOSIS — E109 Type 1 diabetes mellitus without complications: Secondary | ICD-10-CM

## 2021-01-28 MED ORDER — DEXCOM G6 TRANSMITTER MISC
1 refills | Status: DC
Start: 2021-01-28 — End: 2021-04-14

## 2021-01-30 ENCOUNTER — Telehealth (HOSPITAL_BASED_OUTPATIENT_CLINIC_OR_DEPARTMENT_OTHER): Payer: Self-pay | Admitting: "Endocrinology

## 2021-01-30 DIAGNOSIS — E109 Type 1 diabetes mellitus without complications: Secondary | ICD-10-CM

## 2021-01-30 NOTE — Telephone Encounter (Signed)
Returned Pt's mother's call. Pt's mother Rinaldo Cloud) states pt goes to [place] out of state and has a small window to make it in for his appointments. Pt is scheduled with Sarah on 12/19 @ 2:30 pm.     Pt would like to get labs done when he is town this month, Pt's mom states he will go to any [place] lab and will send eCare message if they plan to get labs done outside of [place].

## 2021-01-30 NOTE — Telephone Encounter (Signed)
RETURN CALL: Voicemail - Detailed Message      SUBJECT:  Appointment Request     REASON FOR VISIT: Check-up  PREFERRED DATE/TIME: 10/17-10/21, 12/19-1/13/2023  - any time   ADDITIONAL INFORMATION: Patient's mother called to make an appointment for the patient. Per the patient's schedule Dr. Alben Spittle did not have any available. CCR unable to connect with clinic. Patient's mother was advised a message would be sent to the clinic.     Also wondering about labs, if the patient is due for them? Can they get them without an upcoming appointment while the patient is in town?

## 2021-04-03 ENCOUNTER — Encounter (HOSPITAL_BASED_OUTPATIENT_CLINIC_OR_DEPARTMENT_OTHER): Payer: Self-pay

## 2021-04-11 ENCOUNTER — Encounter (HOSPITAL_BASED_OUTPATIENT_CLINIC_OR_DEPARTMENT_OTHER): Payer: Self-pay | Admitting: Student in an Organized Health Care Education/Training Program

## 2021-04-11 NOTE — Telephone Encounter (Signed)
[facility] Appointment 12/19 - Questionnaires

## 2021-04-14 ENCOUNTER — Other Ambulatory Visit (HOSPITAL_BASED_OUTPATIENT_CLINIC_OR_DEPARTMENT_OTHER): Payer: Self-pay | Admitting: Student in an Organized Health Care Education/Training Program

## 2021-04-14 ENCOUNTER — Ambulatory Visit
Payer: No Typology Code available for payment source | Attending: Student in an Organized Health Care Education/Training Program | Admitting: Student in an Organized Health Care Education/Training Program

## 2021-04-14 ENCOUNTER — Encounter (HOSPITAL_BASED_OUTPATIENT_CLINIC_OR_DEPARTMENT_OTHER): Payer: Self-pay | Admitting: Student in an Organized Health Care Education/Training Program

## 2021-04-14 VITALS — BP 122/74 | HR 76 | Ht 72.5 in | Wt 230.0 lb

## 2021-04-14 DIAGNOSIS — E109 Type 1 diabetes mellitus without complications: Secondary | ICD-10-CM | POA: Insufficient documentation

## 2021-04-14 DIAGNOSIS — Z9641 Presence of insulin pump (external) (internal): Secondary | ICD-10-CM | POA: Insufficient documentation

## 2021-04-14 DIAGNOSIS — E1065 Type 1 diabetes mellitus with hyperglycemia: Secondary | ICD-10-CM | POA: Insufficient documentation

## 2021-04-14 LAB — HEMOGLOBIN A1C, RAPID: Hemoglobin A1C: 6.6 % — ABNORMAL HIGH (ref 4.0–6.0)

## 2021-04-14 MED ORDER — INSULIN ASPART 100 UNIT/ML IJ SOLN
INTRAMUSCULAR | 3 refills | Status: DC
Start: 2021-04-14 — End: 2021-05-06

## 2021-04-14 MED ORDER — BAQSIMI ONE PACK 3 MG/DOSE NA POWD
NASAL | 1 refills | Status: DC
Start: 2021-04-14 — End: 2022-09-28

## 2021-04-14 MED ORDER — DEXCOM G6 TRANSMITTER MISC
1 refills | Status: DC
Start: 2021-04-14 — End: 2021-10-10

## 2021-04-14 MED ORDER — DEXCOM G6 SENSOR MISC
3 refills | Status: DC
Start: 2021-04-14 — End: 2021-10-10

## 2021-04-14 NOTE — Patient Instructions (Addendum)
Hi Uday,    It was an absolute pleasure seeing you today. Thanks for chatting about your diabetes with me! Here's what we discussed:    ISF 7AM on, 1:20 --> 1:18  ICR 7AM on, 1:5 --> 1:4.8    Reminder to schedule eye exam    If you have any questions/concerns, please feel free to reach out to me via MyChart.     Sincerely,  Remigio Eisenmenger, PA-C

## 2021-04-14 NOTE — Progress Notes (Signed)
[facility]AB[facility] CLINIC    Date of service: 04/14/21    Identification: Vincent Anderson is a 20 year old with type 1 diabetes.  -- Date of diabetes diagnosis: 12/25/2004   -- Complications from diabetes:none  -- Secondary diagnoses: ADD, Depression & anxiety (2018), celiac disease    Clinical Care:  -- Received pediatric diabetes care at [facility]; last provider Dr. Danelle Earthly on 08/2019  -- First [facility] AYA visit: 06/2018  -- Last [facility] AYA visit: 08/2019  -- [facility] AYA visit: 03/2020  -- Last [place] AYA visit: 08/2020    Diabetes Self-Management and Interval History:  Things are going fine. Everything out of whack the last month with BGs and school etc. Weekend before Thanksgiving, got a cough and ended up having the flu. Very sick. Took 2 weeks to feel recovered.     Takes many manual boluses which are adjustments from recommended corrections.     Nutrition/Exercise/Safety  -- Meal pattern (e.g., low-carb; skipped meals; late night meals): Gluten-free diet; "not a big breakfast eater", for breakfast he usually has apple, banana, protein bar, lunch - big meal replacement protein shake, salad with chicken, largest meal - dinner - ground beef, tacos, hamburgers, fruit or vegetable, rice, potatoes, french fries, sometimes desert - gluten free cookies or muffins or icecream - overall on low CHO diet with average CHO intake of 96g/day  -- Physical activity: cardio, lifting 45 - 60 min - 4x/week, running 2-5 miles - 45 - 60 min - 5-6x/week has significant drop BG 250 ->80 first 15 min of exercise  -- Exercise management: Used to suspend boluses insulin when on pump, trying not to eating before exercise, since trying to lose weight  -- prandial insulin: 15 min before meal, sometimes do additional bolus if underestimated with meal, uses 3 boluses for protein shake    -- Driving: yes; uses sensor, no hypoglycemic episodes while driving, has juice with him  -- Drug/EtOH use: seltzer, hard liquor - once a week, 4-5 drinks - puts his  insulin pump into exercise mode, takes with CHO, no hypoglycemic episodes with alcohol intake    Since last visit:  -- ER/Hospital visit related to diabetes: No  -- Glucagon use: No [Has Baqsimi]    Review of Systems:   Complete ROS negative except for those noted in HPI above    Social History:  -- In junior year at [employer]  -- Lives in dorms (has strong support)    Family History:  -- No diabetes in family  -- Mom, maternal grandmother, and older sister have thyroid issues  -- Younger sister with celiac disease    Insulin Regimen:   Tandem CIQ      Glucose Monitoring:       Psychosocial screening:  Last psychologist/SW visit: likely on 07/2020  Last completed: 03/2021  -- PHQ-9: 3 (mild); no suicidal ideation  -- PAID-T: 16 (low diabetes distress)  -- GAD-7: 12 (moderate anxiety)  -- DEPS-R: 10 (low risk for disordered eating)   -- CD-RISC 10 - 32    Physical Examination:  VS: BP 122/74    Pulse 76    Ht 6' 0.5" (1.842 m)    Wt (!) 104.3 kg (230 lb)    BMI 30.76 kg/m   General: alert, no distress  MS: pleasant, euthymic  Eyes: Lids/periorbital skin normal, sclera anicteric  Resp: breathing comfortably on room air  Neuro: speech and hearing grossly intact  Foot exam:     -  Visual inspection:  normal to inspection, with intact skin, no significant callus formation, no evidence of ischemia   - Monofilament exam:  normal    - Pulse exam: normal    - Vibration 128-Hz tuning fork:  not done   - Pinprick sensation: not done   - Ankle reflexes:  not done       Labs:      Assessment and Plan:   1. Type 1 Diabetes: A1c 6.6% 03/2021. [place] is doing a great job managing his diabetes with CIQ. He is diligent with his boluses and has a good understanding of how much insulin his body needs. He is frequently following up meal boluses with additional insulin to address postprandial hyperglycemia and adjusting recommended corrections to deliver more to return to range, and he is achieving glycemic targets, which suggests he  needs mildly stronger carb ratio and correction factor during the day to reduce the amount of manual adjustments his system requires of him.     ISF 7AM on, 1:20 --> 1:18  ICR 7AM on, 1:5 --> 1:4.8    2. AYA Care - Transition Readiness:  Diabetes education: Ongoing needs: yes  Nutrition: Ongoing needs: yes  Healthcare Navigation: Ongoing needs: yes  Psychosocial Support: Ongoing needs: yes  GRADUATE FROM PROGRAM: NO    3. Diabetes Complications/Screening:  Retinopathy Screening: Due for exam  Last exam: August 2021, no evidence of DR  No history of diabetic retinopathy  Neuropathy Screening:  Denies symptoms of peripheral or autonomic neuropathy  Autoimmune Conditions Screening: TSH wnl on 03/2021; positive celiac screen   Dyslipidemia Screening: LDL at desired level on 03/2021  Microalbuminuria Screening: no evidence of albuminuria 03/2021  Blood Pressure: At goal today    4. Preventative Care:  Established with PCP: yes Dr. Iona Coach  Received PPSV23 vaccine: yes  Received influenza vaccine: got at school 02/08/21  COVID-19: Pfizer - 3 (last booster on 12/2019)    5. Celiac disease  - continue gluten free diet    Follow-up: Will plan to continue care within [facility] Program at this time with a follow up visit in 3 month's time.    I spent a total time of 45 minutes in this encounter, including 35 minutes of pre-visit chart review, face-to-face with the patient, and post-visit documentation and coordination of care. The rest of the time was spent on CGM interpretation.    To improve glycemic control, patient is instructed how to self-adjust insulin up to 30% from baseline dose.    Remigio Eisenmenger, PA-C  [facility]

## 2021-04-15 LAB — COMPREHENSIVE METABOLIC PANEL
ALT (GPT): 14 U/L (ref 10–64)
AST (GOT): 15 U/L (ref 9–38)
Albumin: 4.6 g/dL (ref 3.5–5.2)
Alkaline Phosphatase (Total): 55 U/L (ref 42–136)
Anion Gap: 6 (ref 4–12)
Bilirubin (Total): 0.4 mg/dL (ref 0.2–1.3)
Calcium: 9.8 mg/dL (ref 8.9–10.2)
Carbon Dioxide, Total: 31 meq/L (ref 22–32)
Chloride: 101 meq/L (ref 98–108)
Creatinine: 1.04 mg/dL (ref 0.51–1.18)
Glucose: 225 mg/dL — ABNORMAL HIGH (ref 62–125)
Potassium: 4.9 meq/L (ref 3.6–5.2)
Protein (Total): 7.1 g/dL (ref 6.0–8.2)
Sodium: 138 meq/L (ref 135–145)
Urea Nitrogen: 14 mg/dL (ref 8–21)
eGFR by CKD-EPI 2021: >60 mL/min/{1.73_m2} (ref >59)

## 2021-04-15 LAB — LIPID PANEL
Cholesterol/HDL Ratio: 3.1
HDL Cholesterol: 49 mg/dL (ref >39)
LDL Cholesterol, NIH Equation: 92 mg/dL (ref <130)
Non-HDL Cholesterol: 101 mg/dL (ref 0–159)
Total Cholesterol: 150 mg/dL (ref <200)
Triglyceride: 42 mg/dL (ref <150)

## 2021-04-15 LAB — ALBUMIN/CREATININE RATIO, RANDOM URINE
Albumin (Micro), URN: <0.7 mg/dL
Albumin/Creatinine Ratio, URN: <16 mg/g{creat} (ref <30)
Creatinine/Unit, URN: 45 mg/dL

## 2021-04-15 LAB — TSH WITH REFLEXIVE FREE T4: TSH with Reflexive Free T4: 2.113 u[IU]/mL (ref 0.400–5.000)

## 2021-05-06 ENCOUNTER — Other Ambulatory Visit (HOSPITAL_BASED_OUTPATIENT_CLINIC_OR_DEPARTMENT_OTHER): Payer: Self-pay | Admitting: Student in an Organized Health Care Education/Training Program

## 2021-05-06 DIAGNOSIS — E109 Type 1 diabetes mellitus without complications: Secondary | ICD-10-CM

## 2021-05-08 MED ORDER — INSULIN ASPART 100 UNIT/ML IJ SOLN
INTRAMUSCULAR | 1 refills | Status: DC
Start: 2021-05-08 — End: 2021-07-23

## 2021-05-08 NOTE — Telephone Encounter (Signed)
Next visit 09/29/21

## 2021-07-23 ENCOUNTER — Other Ambulatory Visit (HOSPITAL_BASED_OUTPATIENT_CLINIC_OR_DEPARTMENT_OTHER): Payer: Self-pay | Admitting: Student in an Organized Health Care Education/Training Program

## 2021-07-23 DIAGNOSIS — E109 Type 1 diabetes mellitus without complications: Secondary | ICD-10-CM

## 2021-07-24 MED ORDER — INSULIN ASPART 100 UNIT/ML IJ SOLN
INTRAMUSCULAR | 0 refills | Status: DC
Start: 2021-07-24 — End: 2021-10-10

## 2021-08-06 ENCOUNTER — Other Ambulatory Visit (HOSPITAL_BASED_OUTPATIENT_CLINIC_OR_DEPARTMENT_OTHER): Payer: Self-pay | Admitting: "Endocrinology

## 2021-09-29 ENCOUNTER — Telehealth (HOSPITAL_BASED_OUTPATIENT_CLINIC_OR_DEPARTMENT_OTHER)
Payer: No Typology Code available for payment source | Admitting: Student in an Organized Health Care Education/Training Program

## 2021-10-08 ENCOUNTER — Encounter (HOSPITAL_BASED_OUTPATIENT_CLINIC_OR_DEPARTMENT_OTHER): Payer: Self-pay | Admitting: Pharmacist

## 2021-10-08 DIAGNOSIS — E109 Type 1 diabetes mellitus without complications: Secondary | ICD-10-CM

## 2021-10-09 ENCOUNTER — Other Ambulatory Visit (HOSPITAL_BASED_OUTPATIENT_CLINIC_OR_DEPARTMENT_OTHER): Payer: Self-pay | Admitting: "Endocrinology

## 2021-10-10 MED ORDER — INSULIN ASPART 100 UNIT/ML IJ SOLN
INTRAMUSCULAR | 1 refills | Status: DC
Start: 2021-10-10 — End: 2022-06-15

## 2021-10-10 MED ORDER — DEXCOM G6 SENSOR MISC
3 refills | Status: DC
Start: 2021-10-10 — End: 2021-10-21

## 2021-10-10 MED ORDER — DEXCOM G6 TRANSMITTER MISC
1 refills | Status: DC
Start: 2021-10-10 — End: 2021-10-21

## 2021-10-14 ENCOUNTER — Encounter (HOSPITAL_BASED_OUTPATIENT_CLINIC_OR_DEPARTMENT_OTHER): Payer: Self-pay | Admitting: "Endocrinology

## 2021-10-14 DIAGNOSIS — E109 Type 1 diabetes mellitus without complications: Secondary | ICD-10-CM

## 2021-10-21 MED ORDER — DEXCOM G6 SENSOR MISC
3 refills | Status: DC
Start: 2021-10-21 — End: 2022-09-28

## 2021-10-21 MED ORDER — DEXCOM G6 TRANSMITTER MISC
1 refills | Status: DC
Start: 2021-10-21 — End: 2021-12-15

## 2021-10-21 NOTE — Telephone Encounter (Signed)
Sending Dexcom G6 to [facility] to check on patient's primary insurance (BCBS) coverage, as mother reports currently Dexcom is being covered by a supplemental plan that they wish to cancel.

## 2021-10-23 ENCOUNTER — Encounter (HOSPITAL_BASED_OUTPATIENT_CLINIC_OR_DEPARTMENT_OTHER): Payer: Self-pay | Admitting: "Endocrinology

## 2021-10-31 ENCOUNTER — Telehealth (HOSPITAL_BASED_OUTPATIENT_CLINIC_OR_DEPARTMENT_OTHER): Payer: Self-pay | Admitting: "Endocrinology

## 2021-10-31 NOTE — Telephone Encounter (Signed)
Incoming DWO from [facility] CGM RF-DEXCOM  CN 04-14-21 - 30 DAYS DEXCOM LOG    To MD for review and signature.  Signed, faxed, scanned to media.

## 2021-12-12 ENCOUNTER — Other Ambulatory Visit (HOSPITAL_BASED_OUTPATIENT_CLINIC_OR_DEPARTMENT_OTHER): Payer: Self-pay | Admitting: Student in an Organized Health Care Education/Training Program

## 2021-12-12 DIAGNOSIS — E109 Type 1 diabetes mellitus without complications: Secondary | ICD-10-CM

## 2021-12-15 ENCOUNTER — Other Ambulatory Visit (HOSPITAL_BASED_OUTPATIENT_CLINIC_OR_DEPARTMENT_OTHER): Payer: Self-pay | Admitting: Student in an Organized Health Care Education/Training Program

## 2021-12-15 DIAGNOSIS — E109 Type 1 diabetes mellitus without complications: Secondary | ICD-10-CM

## 2021-12-15 MED ORDER — DEXCOM G6 TRANSMITTER MISC
1 refills | Status: DC
Start: 2021-12-15 — End: 2021-12-17

## 2021-12-17 MED ORDER — DEXCOM G6 TRANSMITTER MISC
1 refills | Status: DC
Start: 2021-12-17 — End: 2022-09-28

## 2022-01-25 ENCOUNTER — Encounter (HOSPITAL_BASED_OUTPATIENT_CLINIC_OR_DEPARTMENT_OTHER): Payer: Self-pay | Admitting: "Endocrinology

## 2022-01-25 DIAGNOSIS — E109 Type 1 diabetes mellitus without complications: Secondary | ICD-10-CM

## 2022-01-27 MED ORDER — BLOOD GLUCOSE MONITORING SUPPL KIT
PACK | 1 refills | Status: AC
Start: 2022-01-27 — End: ?

## 2022-01-27 MED ORDER — GLUCOSE BLOOD VI STRP
ORAL_STRIP | 0 refills | Status: DC
Start: 2022-01-27 — End: 2022-09-28

## 2022-04-06 ENCOUNTER — Encounter (HOSPITAL_BASED_OUTPATIENT_CLINIC_OR_DEPARTMENT_OTHER): Payer: Self-pay

## 2022-04-13 ENCOUNTER — Other Ambulatory Visit (HOSPITAL_BASED_OUTPATIENT_CLINIC_OR_DEPARTMENT_OTHER): Payer: Self-pay | Admitting: "Endocrinology

## 2022-04-13 ENCOUNTER — Encounter (HOSPITAL_BASED_OUTPATIENT_CLINIC_OR_DEPARTMENT_OTHER): Payer: Self-pay | Admitting: "Endocrinology

## 2022-04-13 ENCOUNTER — Ambulatory Visit: Payer: No Typology Code available for payment source | Attending: "Endocrinology | Admitting: "Endocrinology

## 2022-04-13 VITALS — BP 110/73 | HR 88 | Ht 72.5 in | Wt 236.0 lb

## 2022-04-13 DIAGNOSIS — E109 Type 1 diabetes mellitus without complications: Secondary | ICD-10-CM | POA: Insufficient documentation

## 2022-04-13 LAB — HEMOGLOBIN A1C, RAPID: Hemoglobin A1C: 6.9 % — ABNORMAL HIGH (ref 4.0–6.0)

## 2022-04-13 NOTE — Progress Notes (Addendum)
ADOLESCENT AND YOUNG ADULT DIABETES CLINIC    Date of service: 04/13/2022    Identification: Vincent Anderson is a 21 year old with type 1 diabetes.  -- Date of diabetes diagnosis: [id number]   -- Complications from diabetes:none  -- Secondary diagnoses: ADD, Depression & anxiety (2018), celiac disease    Clinical Care:  -- Received pediatric diabetes care at [facility]; last provider Dr. 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   Giving insulin before eating. He has been more conservative lately due to issues with lows overnight.   Finds pump is overcorrecting hypoglycemia.   No severe lows, but has had recurrent overnight hypoglycemia. Not related to alcohol intake.     Nutrition/Exercise/Safety  -- Meal pattern (e.g., low-carb; skipped meals; late night meals):   -- Physical activity: cardio, lifting 45 - 60 min - 4x/week, running 2-5 miles - 45 - 60 min - 5-6x/week has significant drop BG 250 ->80 first 15 min of exercise  -- Exercise management: Used to suspend boluses insulin when on pump 15 to 30 mins before, trying not to eating before exercise  -- prandial insulin: 15 min before meal, sometimes do additional bolus if underestimated with meal, uses 3 boluses for protein shake    -- Driving: yes; uses sensor, no hypoglycemic episodes while driving, has juice with him  -- Drug/EtOH use: seltzer, hard liquor - once a week, 4-5 drinks - puts his insulin pump into exercise mode, takes with CHO, no hypoglycemic episodes with alcohol intake    Since last visit: None.  Updated 04/13/22  -- ER/Hospital visit related to diabetes: No  -- Glucagon use: No [Has Baqsimi]    Review of Systems:   Complete ROS negative except for those noted in HPI above    Social History:  -- In senior year at [employer]  -- Lives in [place] (has strong support)  -- Going to move to [place] in Summer 2023. [name] starting August 2024.     Family History:  -- No diabetes in family  -- Mom, maternal grandmother, and older sister have thyroid issues  -- Younger sister with celiac disease    Insulin Regimen:   Tandem CIQ    Psychosocial screening:  Last psychologist/SW visit: likely on 07/2020  Last completed: 03/2022.     Physical Examination:  VS: BP 110/73   Pulse 88   Ht 6' 0.5" (1.842 m)   Wt (!) 107 kg (236 lb)   BMI 31.57 kg/m   General: alert, no distress  MS: pleasant, euthymic  Eyes: Lids/periorbital skin normal, sclera anicteric  Resp: breathing comfortably on room air  Neuro: speech and hearing grossly intact  Foot exam:     - Visual inspection:  normal to inspection, with intact skin, no significant callus formation, no evidence of ischemia   - Monofilament exam:  normal    - Pulse exam: normal    - Vibration 128-Hz tuning fork:  not done   -  Pinprick sensation: not done   - Ankle reflexes:  not done     Assessment and Plan:   1. Type 1 Diabetes:   [name] is doing a great job managing his diabetes with CIQ. He is doing great with carb boluses. His TIR is 73 to 75%. He notices the trend wherein his sugars trend lower in the mornings. Moving to [place] after graduation for his new job in August 2024    - will change ISF:  0300 - 0700 : 1:25 >> 1:30  0700 - 1100 : 1:18 >> 1:22    - will repeat hemoglobin A1c, lipid profile, TSH, urine albumin/creat ratio today  - will see him in clinic in June'24 before he moves to [place] for his new job  - will email him options for Diabetes providers in [place]    2. Anderson Care - Transition Readiness:  Diabetes education: Ongoing needs: yes  Nutrition:  Ongoing needs: yes  Healthcare Navigation: Ongoing needs: yes  Psychosocial Support: Ongoing needs: yes  GRADUATE FROM PROGRAM: NO    3. Diabetes Complications/Screening:  Retinopathy Screening:   Last exam: May 2023  No history of diabetic retinopathy  Neuropathy Screening:  Denies symptoms of peripheral or autonomic neuropathy. Last visit 03/2022  Autoimmune Conditions Screening: check today  Dyslipidemia Screening: check today  Microalbuminuria Screening: check today  Blood Pressure: At goal today    4. Preventative Care:  Established with PCP: yes Dr. Pecola Leisure  Received PPSV23 vaccine: yes  Received influenza vaccine: not discussed  COVID-19: Pfizer - 3 (last booster on 12/2019)    5. Celiac disease  - continue gluten free diet    Follow-up: Follow up in [facility]. Graduate after next visit, he is moving to [place] in August.     To improve glycemic control, patient is instructed how to self-adjust insulin up to 30% from baseline dose.    Plan discussed with Attending Dr. Charm Rings, MD  Endocrinology Fellow

## 2022-04-14 LAB — BASIC METABOLIC PANEL
Anion Gap: 11 (ref 4–12)
Calcium: 9.2 mg/dL (ref 8.9–10.2)
Carbon Dioxide, Total: 27 meq/L (ref 22–32)
Chloride: 101 meq/L (ref 98–108)
Creatinine: 0.89 mg/dL (ref 0.51–1.18)
Glucose: 118 mg/dL (ref 62–125)
Potassium: 4.5 meq/L (ref 3.6–5.2)
Sodium: 139 meq/L (ref 135–145)
Urea Nitrogen: 16 mg/dL (ref 8–21)
eGFR by CKD-EPI 2021: >60 mL/min/{1.73_m2} (ref >59)

## 2022-04-14 LAB — ALBUMIN/CREATININE RATIO, RANDOM URINE
Albumin (Micro), URN: <0.7 mg/dL
Albumin/Creatinine Ratio, URN: <5 mg/g{creat} (ref <30)
Creatinine/Unit, URN: 141 mg/dL

## 2022-04-14 LAB — TSH WITH REFLEXIVE FREE T4: TSH with Reflexive Free T4: 1.539 u[IU]/mL (ref 0.400–5.000)

## 2022-04-14 LAB — LIPID PANEL
Cholesterol/HDL Ratio: 3.1
HDL Cholesterol: 54 mg/dL (ref >39)
LDL Cholesterol, NIH Equation: 104 mg/dL (ref <130)
Non-HDL Cholesterol: 114 mg/dL (ref 0–159)
Total Cholesterol: 168 mg/dL (ref <200)
Triglyceride: 46 mg/dL (ref <150)

## 2022-04-23 NOTE — Progress Notes (Signed)
I saw and evaluated the patient. I have reviewed the resident(s)'s/fellow(s)'s documentation and agree.

## 2022-06-15 ENCOUNTER — Other Ambulatory Visit (HOSPITAL_BASED_OUTPATIENT_CLINIC_OR_DEPARTMENT_OTHER): Payer: Self-pay | Admitting: Student in an Organized Health Care Education/Training Program

## 2022-06-15 DIAGNOSIS — E109 Type 1 diabetes mellitus without complications: Secondary | ICD-10-CM

## 2022-06-17 MED ORDER — INSULIN ASPART 100 UNIT/ML IJ SOLN
INTRAMUSCULAR | 0 refills | Status: DC
Start: 2022-06-17 — End: 2022-09-28

## 2022-06-17 NOTE — Telephone Encounter (Signed)
NV 6/3

## 2022-09-28 ENCOUNTER — Ambulatory Visit: Payer: No Typology Code available for payment source | Attending: "Endocrinology | Admitting: "Endocrinology

## 2022-09-28 ENCOUNTER — Encounter (HOSPITAL_BASED_OUTPATIENT_CLINIC_OR_DEPARTMENT_OTHER): Payer: Self-pay | Admitting: "Endocrinology

## 2022-09-28 VITALS — BP 118/79 | HR 67 | Ht 72.75 in | Wt 235.0 lb

## 2022-09-28 DIAGNOSIS — E109 Type 1 diabetes mellitus without complications: Secondary | ICD-10-CM | POA: Insufficient documentation

## 2022-09-28 DIAGNOSIS — Z9641 Presence of insulin pump (external) (internal): Secondary | ICD-10-CM

## 2022-09-28 MED ORDER — PRECISION XTRA KETONE VI STRP
ORAL_STRIP | 3 refills | Status: AC
Start: 2022-09-28 — End: ?

## 2022-09-28 MED ORDER — DEXCOM G6 SENSOR MISC
3 refills | Status: AC
Start: 2022-09-28 — End: ?

## 2022-09-28 MED ORDER — INSULIN ASPART 100 UNIT/ML IJ SOLN
INTRAMUSCULAR | 3 refills | Status: AC
Start: 2022-09-28 — End: ?

## 2022-09-28 MED ORDER — BASAGLAR KWIKPEN 100 UNIT/ML SC SOPN
PEN_INJECTOR | SUBCUTANEOUS | 1 refills | Status: AC
Start: 2022-09-28 — End: ?

## 2022-09-28 MED ORDER — DEXCOM G6 TRANSMITTER MISC
1 refills | Status: AC
Start: 2022-09-28 — End: ?

## 2022-09-28 MED ORDER — GLUCOSE BLOOD VI STRP
ORAL_STRIP | 0 refills | Status: AC
Start: 2022-09-28 — End: ?

## 2022-09-28 MED ORDER — BAQSIMI ONE PACK 3 MG/DOSE NA POWD
NASAL | 1 refills | Status: AC
Start: 2022-09-28 — End: ?

## 2022-09-28 NOTE — Progress Notes (Addendum)
ADOLESCENT AND YOUNG ADULT DIABETES CLINIC    Date of service: 09/28/22    Identification: Vincent Anderson is a 22 year old with type 1 diabetes.  -- Date of diabetes diagnosis: 12/25/2004   -- Complications from diabetes:none  -- Secondary diagnoses: ADD, Depression & anxiety (2018), celiac disease    Clinical Care:  -- Received pediatric diabetes care at [facility]; last provider Dr. Danelle Anderson on 08/2019  -- First [place] Anderson visit: 06/2018  -- Last [facility] Anderson visit: 08/2019  -- [place] Anderson visit: 03/2020  -- Last [place] Anderson visit: 03/2022    Diabetes Self-Anderson and Interval History:  Graduated from [place] 09/13/22.   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   Diabetes is going ok. More erratic with finals, graduation, travel  He is worried about lows, tends to underbolus rather than overbolus.     He is very susceptible to lows when he is active.   Sometimes the walk to class he has double arrows down.   He will be walking to work when he moves to [place].     Exercises - recently not working out very much. Consistency varies based on what is going on in life. He will join a gym at work.     He is just hanging out this summer.     Mental health is "fine". It is up and down. Sleep has been "ok  He did have a regular therapist in [place]. His psychiatrist is here in [place]. Will definitely see a therapist in [place] as well.     He is going to stay on his parent's insurance and will move over to his work insurance in the fall.     Still ocassionally overriding.     Nutrition/Exercise/Safety  -- Meal pattern (e.g., low-carb; skipped meals; late night meals):   -- Physical activity: cardio, lifting 45 - 60 min - 4x/week, running 2-5 miles - 45 - 60 min - 5-6x/week has significant drop Anderson 250 ->80 first 15 min of exercise  -- Exercise Anderson: Used to suspend boluses insulin when on pump 15 to 30 mins before, trying not to eating before exercise  -- prandial insulin: 15 min before meal, sometimes do additional bolus if underestimated with  meal, uses 3 boluses for protein shake    -- Driving: yes; uses sensor.   -- Drug/EtOH use: social use. no drug use.     Since last visit: None. Updated 09/28/22  -- ER/Hospital visit related to diabetes: No  -- Glucagon use: No [Has [place]]    Review of Systems:   Complete ROS negative except for those noted in HPI above    Social History:  -- In senior year at [place]  -- Lives in dorms (has strong support)  -- Going to move to [place] in Summer 2023. [place] starting August 2024.     Family History:  -- No diabetes in family  -- Mom, maternal grandmother, and older sister have thyroid issues  -- Younger sister with celiac disease    Insulin Regimen:   Tandem CIQ    Psychosocial screening:  Last psychologist/SW visit: likely on 07/2020  Last completed assessments: 09/28/22    Physical Examination:  VS: BP 118/79   Pulse 67   Ht 6' 0.75" (1.848 m)   Wt (!) 106.6 kg (235 lb)   BMI 31.22 kg/m   Well-appearing, no distress  Speaking in full sentences  Breathing comfortably on room air  Normal mood and  affect     Assessment and Plan:   1. Type 1 Diabetes:   Vincent Anderson is doing a great job managing his diabetes with CIQ. He is doing great with carb boluses. His TIR is meeting glycemic targets. He does tend to underdose carbs and spikes after meals. Pump ISF quite aggressive. Discussed with Vincent Anderson will likely be more stable if he is honest up front with carbs.     Planning on transitioning care to [place] (he has contact info and referral in place).   Vincent Anderson will make sure to talk to [place] about his diabetes  Meds filled  Labs ordered  SW and RD in the next few weeks.     2. Anderson Care - Transition Readiness:  Diabetes education: Ongoing needs: No. READDY 5/5 today.   Nutrition: Ongoing needs: yes - will see in a few weeks.   Healthcare Navigation: Ongoing needs: Yes - will see SW in a few weeks.   Psychosocial Support: Ongoing needs: yes, but has psychologist.   GRADUATE FROM Anderson: NO    3. Diabetes  Complications/Screening:  Retinopathy Screening:   Last exam: May 2023  No history of diabetic retinopathy  Neuropathy Screening:  Denies symptoms of peripheral or autonomic neuropathy. Last visit 03/2022  Autoimmune Conditions Screening: check today  Dyslipidemia Screening: check today  Microalbuminuria Screening: check today  Blood Pressure: At goal today    4. Preventative Care:  Established with PCP: yes Dr. Pecola Anderson  Received PPSV23 vaccine: yes  Received influenza vaccine: not discussed  COVID-19: Pfizer - 3 (last booster on 12/2019)    5. Celiac disease  - continue gluten free diet    Follow-up: Follow up in [place].   To improve glycemic control, patient is instructed how to self-adjust insulin up to 30% from baseline dose.    Plan discussed with Attending Dr. Charm Rings, MD  Endocrinology Fellow

## 2022-10-12 ENCOUNTER — Encounter (HOSPITAL_BASED_OUTPATIENT_CLINIC_OR_DEPARTMENT_OTHER): Payer: Self-pay | Admitting: Student in an Organized Health Care Education/Training Program

## 2022-10-16 ENCOUNTER — Telehealth (HOSPITAL_BASED_OUTPATIENT_CLINIC_OR_DEPARTMENT_OTHER): Payer: Self-pay

## 2022-10-16 NOTE — Telephone Encounter (Signed)
See attached PA in media

## 2022-10-27 ENCOUNTER — Other Ambulatory Visit (HOSPITAL_BASED_OUTPATIENT_CLINIC_OR_DEPARTMENT_OTHER): Payer: Self-pay

## 2022-10-27 NOTE — Telephone Encounter (Signed)
Prior Authorization Canceled    The insurance has canceled this prior authorization request for Dexcom G6 Sensor.      After processing a test claim and submitting a prior authorization request,     the prescription is too soon to refill, the last fill was on 10/15/22.  Next available fill date 12/26/22.     No further action taken/needed by The Prior Authorization Center at this time.    Per Test Claim:      Per fill history:      Per CMM:

## 2022-10-27 NOTE — Telephone Encounter (Signed)
Prior Authorization Pending    Medication:   Dexcom G6 Sensor  Quantity/Day supply:   3 / 30  Prescription Insurance:   Alfredo Batty [facility])   PAC Workflow Exemption Request:   None  Pharmacy:   [facility] [phone] [place] [phone] [[id number]]    Date Submitted:   10/27/2022   Type of Request:  [x]  Cover My Meds Key: [id number]    After The Prior Authorization Center has changed the status of the request to pending, please allow a minimum of 3 to 5 business days for the insurance to respond. (some insurances may take longer).

## 2022-11-02 ENCOUNTER — Ambulatory Visit
Payer: No Typology Code available for payment source | Attending: "Endocrinology | Admitting: Unknown Physician Specialty

## 2022-11-02 ENCOUNTER — Ambulatory Visit: Payer: No Typology Code available for payment source | Admitting: Clinical

## 2022-11-02 DIAGNOSIS — E109 Type 1 diabetes mellitus without complications: Secondary | ICD-10-CM | POA: Insufficient documentation

## 2022-11-02 NOTE — Progress Notes (Signed)
[facility] Nutrition Visit    I conducted this encounter from [facility]  via secure, live, face-to-face video conference with the patient and family. Patient was located at home.  Consent for telemedicine was obtained.    Diagnosis: Type 1 Diabetes  Present at Visit: Patient    Age: 22 years    Medical History: T1DM    Profession/School/Personal: Just graduated from [employer] and now back home for summer. Has a job lined up working in Education officer, environmental, will be moving to [place], in Sept. This summer working out, seeing family.  Lives at home for summer with family.       Visit Goals:    Last seen by RD: Not seen    Provider Concerns: No breakfast. Exercise hypos.     Patient questions, goals and concerns: Working on portion control and macronutrients. Working on eating breakfast- did not do so for much of life. Trying to lose weight, gain muscle. Gained weight a few years go from 160-280, now down to 230. Tracking calories and macros.       Nutrition History:    Avoidances, eating patterns & behaviors, long-term nutrition issues: Gluten free for celiac disease, good at staying GF even at restaurants.      Trying to lose weight, trying to eat more consistently, trying to avoid late night snacking. Uses myfitnesspal. Trying for 1800 calories. Trying for 200 grams protein.     Interval history: NA    Physical activity: Goes to the gym 3-5 times per week, for 1-2 hours. Cardio and lifting. Has a trainer. Uses exercise mode.     Medications and Labs: A1c 6.9 on 04/13/22.     GI: no issues other than celiac.     Diet Recall:     Summer at home:    Awake 7/8am (or earlier if meeting trainer)    In the past did not eat anything until 2pm    For the past 1.5 months has been working on eating breakfast.     7am: toast, eggs, cheese, avocado OR yogurt OR meat sandwich. Coffee (grande cold brew) or energy drink (SF).   (Underboluses if working out)    May work out     12 - baked chicken nuggets and fries OR steak and rice OR  chicken and rice OR chik-fil-A. Drinks water.     Grazes in afternoon - chips, fruit    Dinner- 6pm - steak, potatoes, green beans, dessert.     Later - snacking on oreos, sandwich, salami and cheese, chips.    Drinks - water, SF energy drink, cold brew, SF soda, very occasional alcohol.             Nutrition and Diabetes:    Carb counting: reads labels, historical memory, occasionally googles. Says an apple is about 10 grams carb.     Insuling dosing & bolusing: Puts grams of carb in pump usually - sometimes just puts units insulin in pump. Puts in for both meals and snacks.     Insulin and blood glucose technology & insights: Dexcom G6, Tandem in Control IQ. Carbs before working out and then puts pump in sleep mode. Water with creatine while working. Works well. Starts 150, drops to 100, then levels to 120. Working out in AM works because less insulin on board.       Anthropometrics:  106.6 kg    Nutrition Diagnosis:  Impaired nutrient utilization related to diabetes mellitus as evidenced by elevated A1c, glucose  excursions.     Nutrition Intervention, Counseling and Goals:    We discussed the following points today:  - Your calorie goal of 1800 for weight loss is very low -- 2000 is better but even that is low. If something else between 2000-2500 works better for comfort and sustainability, please consider that.   - Your protein goal of 200 grams/day is very high -- probably what I would recommend for a body builder. I would say a range between 160-200 for you is adequate for weight loss and/or muscle building.   - I recommend including protein, starch, and fruits/veg/fiber with every meal and snack -- seems like you are doing that for meals but could work on including protein and fiber in your snacks. Instead of chips have a half sandwich and piece of fruit or nuts or yogurt.   - In general it looks like you would benefit from more fiber - that means leaning into fruits and veggies, if possible adding a serving  to all meals. The micronutrients are important, and fiber will help you stay full and stabilize blood sugar.   - Instead of grazing, it would be beneficial to have an intentional afternoon snack and late evening snack. Include all food groups - see above for ideas. Think of it as a mini-meal, put it on a plate. Then don't graze before or after.   - We discussed the importance of and reasons for putting the grams of carb into your pump, rather than units of insulin.   - Discussed carb counting methods, strategies, and resources.  - Carb counting refresher. Spend 1-3 weeks weighing/measuring the foods you eat as often as possible and looking up the amount of carbs in those foods and portions. This will refresh your knowledge on your portions and carb intake - these may change with time. Take notes so you can refer to this later. You can implement this however works for you - spend one intensive week measuring/looking up almost everything, or spend several weeks looking up at least one food per day. Continue until you have covered your favorite and most commonly eaten foods and meals.   - Please make a follow up visit with me at the end of the summer before you move if you would like.     Monitoring and Evaluation:  Blood sugar management.     [facility] Graduation Criteria:  Accuracy of carb counts: In process. Undercounting. Discussed on 11/01/22 and will do a refresher.   Ability to determine appropriate insulin doses for food and BG: did not discuss  Ability to identify patterns and self-adjust insulin dosing to achieve euglycemia: did not discuss  Intersection of general nutrition management and diabetes: discussed balance and regularity 11/01/22  Ready for adult care: expected in 6-12 months after 1-2 more nutrition visits.       Follow up: 6-12 months.     Time Spent: 35 minutes.

## 2022-11-02 NOTE — Patient Instructions (Signed)
Nutrition Intervention, Counseling and Goals:    We discussed the following points today:  - Your calorie goal of 1800 for weight loss is very low -- 2000 is better but even that is low. If something else between 2000-2500 works better for comfort and sustainability, please consider that.   - Your protein goal of 200 grams/day is very high -- probably what I would recommend for a body builder. I would say a range between 160-200 for you is adequate for weight loss and/or muscle building.   - I recommend including protein, starch, and fruits/veg/fiber with every meal and snack -- seems like you are doing that for meals but could work on including protein and fiber in your snacks. Instead of chips have a half sandwich and piece of fruit or nuts or yogurt.   - In general it looks like you would benefit from more fiber - that means leaning into fruits and veggies, if possible adding a serving to all meals. The micronutrients are important, and fiber will help you stay full and stabilize blood sugar.   - Instead of grazing, it would be beneficial to have an intentional afternoon snack and late evening snack. Include all food groups - see above for ideas. Think of it as a mini-meal, put it on a plate. Then don't graze before or after.   - We discussed the importance of and reasons for putting the grams of carb into your pump, rather than units of insulin.   - Discussed carb counting methods, strategies, and resources.  - Carb counting refresher. Spend 1-3 weeks weighing/measuring the foods you eat as often as possible and looking up the amount of carbs in those foods and portions. This will refresh your knowledge on your portions and carb intake - these may change with time. Take notes so you can refer to this later. You can implement this however works for you - spend one intensive week measuring/looking up almost everything, or spend several weeks looking up at least one food per day. Continue until you have covered  your favorite and most commonly eaten foods and meals.   - Please make a follow up visit with me at the end of the summer before you move if you would like.

## 2022-11-05 ENCOUNTER — Ambulatory Visit: Payer: No Typology Code available for payment source | Admitting: Clinical

## 2022-11-05 DIAGNOSIS — E109 Type 1 diabetes mellitus without complications: Secondary | ICD-10-CM

## 2022-11-05 NOTE — Progress Notes (Signed)
[facility]  Social Work Progress Note     REFERRAL INFORMATION:  Paddy is a 22 year old patient with type 1 diabetes who is scheduled for a social work visit on telemedicine. Social work referral is for a supportive check-in ahead of Deny's move out of state for a new job. SW met with Greig Castilla by Sherolyn Buba, who was located at his home in [place] state.      RELEVANT PSYCHOSOCIAL INFORMATION:  Daeshon will be graduating soon and is moving to [place] to start a new job. We met today to check-in about his preparation for transitioning his care to [place]. Linden has been in touch with [facility]; he has a visit with an endo RN set up for late August who will help him get his prescriptions transferred over and get him set up with his first endocrinology appointment. Arsal will be going to [place] in late August for a work training, then moving to [place] end of August, and starting his new job on September 3. He has an apartment and found a roommate who is also a new graduate at a similar life stage to him. Kevontay has a number of friends who are also living in [place] and his maternal extended family is on the [address]. In regards to insurance, Nareg is on his dad's insurance and this will cover him at [place] until his new plan through his employer becomes active September 1.     PSYCHOSOCIAL IMPRESSION:  Tarus is feeling excited and good about his move to [place]. He admits to some anxiety but has an understanding that this is a natural response and there is bound to be some no matter how prepared he is. Kelcy has taken all of the right steps to prepare for this move, make sure he has care set up when he gets there, can access supplies, and is covered by insurance. He will also have a good support system when he gets to [place]. I have no concerns about how he will do with this transition.      INTERVENTION AND PLAN:  - Checked in about steps taken to prepare for move out of state

## 2023-03-15 ENCOUNTER — Encounter (HOSPITAL_BASED_OUTPATIENT_CLINIC_OR_DEPARTMENT_OTHER): Payer: Self-pay | Admitting: "Endocrinology

## 2023-11-04 ENCOUNTER — Telehealth (HOSPITAL_BASED_OUTPATIENT_CLINIC_OR_DEPARTMENT_OTHER): Payer: Self-pay | Admitting: Student in an Organized Health Care Education/Training Program

## 2023-11-04 NOTE — Telephone Encounter (Signed)
 Incoming DWO from [facility] FOR SENSOR/TRANS RF    To MD for review and signature.  Signed, faxed, scanned to media.

## 2024-01-05 ENCOUNTER — Emergency Department: Payer: Self-pay

## 2024-02-26 ENCOUNTER — Encounter (HOSPITAL_BASED_OUTPATIENT_CLINIC_OR_DEPARTMENT_OTHER): Payer: Self-pay | Admitting: "Endocrinology
